# Patient Record
Sex: Male | Born: 1995
Health system: Southern US, Community
[De-identification: ages and names within clinical notes are randomized; demographics above are authoritative.]

## PROBLEM LIST (undated history)

## (undated) DIAGNOSIS — I10 Essential (primary) hypertension: Secondary | ICD-10-CM

## (undated) HISTORY — PX: KNEE SURGERY: SHX244

## (undated) HISTORY — PX: FOOT SURGERY: SHX648

---
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
## 2015-07-14 ENCOUNTER — Emergency Department (HOSPITAL_COMMUNITY)
Admission: EM | Admit: 2015-07-14 | Discharge: 2015-07-15 | Disposition: A | Discharge status: Home / Self Care | Payer: No Typology Code available for payment source | Attending: Emergency Medicine | Admitting: Emergency Medicine

## 2015-07-14 ENCOUNTER — Encounter (HOSPITAL_COMMUNITY): Payer: Self-pay | Admitting: *Deleted

## 2015-07-14 ENCOUNTER — Emergency Department (HOSPITAL_COMMUNITY): Payer: No Typology Code available for payment source

## 2015-07-14 DIAGNOSIS — S59902A Unspecified injury of left elbow, initial encounter: Secondary | ICD-10-CM | POA: Insufficient documentation

## 2015-07-14 DIAGNOSIS — Z9889 Other specified postprocedural states: Secondary | ICD-10-CM | POA: Insufficient documentation

## 2015-07-14 DIAGNOSIS — M79672 Pain in left foot: Secondary | ICD-10-CM | POA: Diagnosis not present

## 2015-07-14 DIAGNOSIS — S99912A Unspecified injury of left ankle, initial encounter: Secondary | ICD-10-CM | POA: Insufficient documentation

## 2015-07-14 DIAGNOSIS — S8992XA Unspecified injury of left lower leg, initial encounter: Secondary | ICD-10-CM | POA: Diagnosis not present

## 2015-07-14 DIAGNOSIS — F17200 Nicotine dependence, unspecified, uncomplicated: Secondary | ICD-10-CM | POA: Insufficient documentation

## 2015-07-14 DIAGNOSIS — Y92410 Unspecified street and highway as the place of occurrence of the external cause: Secondary | ICD-10-CM | POA: Diagnosis not present

## 2015-07-14 DIAGNOSIS — M25572 Pain in left ankle and joints of left foot: Secondary | ICD-10-CM

## 2015-07-14 DIAGNOSIS — Y998 Other external cause status: Secondary | ICD-10-CM | POA: Insufficient documentation

## 2015-07-14 DIAGNOSIS — M25522 Pain in left elbow: Secondary | ICD-10-CM

## 2015-07-14 DIAGNOSIS — Y9389 Activity, other specified: Secondary | ICD-10-CM | POA: Insufficient documentation

## 2015-07-14 DIAGNOSIS — M25562 Pain in left knee: Secondary | ICD-10-CM

## 2015-07-14 MED ORDER — OXYCODONE-ACETAMINOPHEN 5-325 MG PO TABS
2.0000 | ORAL_TABLET | Freq: Once | ORAL | Status: AC
  Administered 2015-07-14: 2 via ORAL
  Filled 2015-07-14: qty 2
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
## 2015-07-15 NOTE — Discharge Instructions (Signed)
All x-rays are normal. Ankle brace, ice to painful areas. Tylenol and/or ibuprofen for pain. You will be sore for several days. Follow-up with orthopedics if not getting better.

## 2016-10-29 DIAGNOSIS — J069 Acute upper respiratory infection, unspecified: Secondary | ICD-10-CM | POA: Diagnosis not present

## 2016-10-29 DIAGNOSIS — E663 Overweight: Secondary | ICD-10-CM | POA: Diagnosis not present

## 2016-10-29 DIAGNOSIS — J302 Other seasonal allergic rhinitis: Secondary | ICD-10-CM | POA: Diagnosis not present

## 2016-10-29 DIAGNOSIS — Z6826 Body mass index (BMI) 26.0-26.9, adult: Secondary | ICD-10-CM | POA: Diagnosis not present

## 2016-10-29 DIAGNOSIS — Z1389 Encounter for screening for other disorder: Secondary | ICD-10-CM | POA: Diagnosis not present

## 2016-10-29 DIAGNOSIS — M545 Low back pain: Secondary | ICD-10-CM | POA: Diagnosis not present

## 2016-10-29 DIAGNOSIS — E669 Obesity, unspecified: Secondary | ICD-10-CM | POA: Diagnosis not present

## 2016-10-29 DIAGNOSIS — H6693 Otitis media, unspecified, bilateral: Secondary | ICD-10-CM | POA: Diagnosis not present

## 2016-10-31 ENCOUNTER — Emergency Department (HOSPITAL_COMMUNITY): Payer: BLUE CROSS/BLUE SHIELD

## 2016-10-31 ENCOUNTER — Encounter (HOSPITAL_COMMUNITY): Payer: Self-pay | Admitting: Emergency Medicine

## 2016-10-31 ENCOUNTER — Emergency Department (HOSPITAL_COMMUNITY)
Admission: EM | Admit: 2016-10-31 | Discharge: 2016-10-31 | Disposition: A | Discharge status: Home / Self Care | Payer: BLUE CROSS/BLUE SHIELD | Attending: Emergency Medicine | Admitting: Emergency Medicine

## 2016-10-31 DIAGNOSIS — R42 Dizziness and giddiness: Secondary | ICD-10-CM | POA: Diagnosis not present

## 2016-10-31 DIAGNOSIS — F17200 Nicotine dependence, unspecified, uncomplicated: Secondary | ICD-10-CM | POA: Diagnosis not present

## 2016-10-31 DIAGNOSIS — N390 Urinary tract infection, site not specified: Secondary | ICD-10-CM | POA: Diagnosis not present

## 2016-10-31 DIAGNOSIS — H9201 Otalgia, right ear: Secondary | ICD-10-CM | POA: Diagnosis not present

## 2016-10-31 DIAGNOSIS — R51 Headache: Secondary | ICD-10-CM | POA: Insufficient documentation

## 2016-10-31 DIAGNOSIS — R1110 Vomiting, unspecified: Secondary | ICD-10-CM | POA: Diagnosis present

## 2016-10-31 DIAGNOSIS — H6121 Impacted cerumen, right ear: Secondary | ICD-10-CM | POA: Insufficient documentation

## 2016-10-31 LAB — CBC WITH DIFFERENTIAL/PLATELET
BASOS ABS: 0 10*3/uL (ref 0.0–0.1)
BASOS PCT: 0 %
EOS ABS: 0 10*3/uL (ref 0.0–0.7)
Eosinophils Relative: 0 %
HCT: 45.9 % (ref 39.0–52.0)
Hemoglobin: 16.3 g/dL (ref 13.0–17.0)
Lymphocytes Relative: 19 %
Lymphs Abs: 2.7 10*3/uL (ref 0.7–4.0)
MCH: 29.7 pg (ref 26.0–34.0)
MCHC: 35.5 g/dL (ref 30.0–36.0)
MCV: 83.8 fL (ref 78.0–100.0)
MONO ABS: 1.7 10*3/uL — AB (ref 0.1–1.0)
Monocytes Relative: 12 %
NEUTROS PCT: 69 %
Neutro Abs: 9.6 10*3/uL — ABNORMAL HIGH (ref 1.7–7.7)
PLATELETS: 246 10*3/uL (ref 150–400)
RBC: 5.48 MIL/uL (ref 4.22–5.81)
RDW: 13.5 % (ref 11.5–15.5)
WBC: 14 10*3/uL — AB (ref 4.0–10.5)

## 2016-10-31 LAB — COMPREHENSIVE METABOLIC PANEL
ALBUMIN: 3.9 g/dL (ref 3.5–5.0)
ALT: 30 U/L (ref 17–63)
AST: 21 U/L (ref 15–41)
Alkaline Phosphatase: 73 U/L (ref 38–126)
Anion gap: 14 (ref 5–15)
BUN: 19 mg/dL (ref 6–20)
CHLORIDE: 100 mmol/L — AB (ref 101–111)
CO2: 26 mmol/L (ref 22–32)
Calcium: 9.4 mg/dL (ref 8.9–10.3)
Creatinine, Ser: 1.08 mg/dL (ref 0.61–1.24)
GFR calc non Af Amer: >60 mL/min (ref >60)
Glucose, Bld: 96 mg/dL (ref 65–99)
Potassium: 3.3 mmol/L — ABNORMAL LOW (ref 3.5–5.1)
SODIUM: 140 mmol/L (ref 135–145)
Total Bilirubin: 0.9 mg/dL (ref 0.3–1.2)
Total Protein: 8.2 g/dL — ABNORMAL HIGH (ref 6.5–8.1)

## 2016-10-31 LAB — URINALYSIS, ROUTINE W REFLEX MICROSCOPIC
BACTERIA UA: NONE SEEN
GLUCOSE, UA: NEGATIVE mg/dL
HGB URINE DIPSTICK: NEGATIVE
Ketones, ur: 5 mg/dL — AB
NITRITE: NEGATIVE
PROTEIN: 100 mg/dL — AB
Specific Gravity, Urine: 1.03 (ref 1.005–1.030)
pH: 5 (ref 5.0–8.0)

## 2016-10-31 MED ORDER — ONDANSETRON HCL 4 MG PO TABS: 4.0000 mg | ORAL_TABLET | Freq: Four times a day (QID) | ORAL | 0 refills | Status: DC

## 2016-10-31 MED ORDER — ONDANSETRON HCL 4 MG/2ML IJ SOLN
4.0000 mg | Freq: Once | INTRAMUSCULAR | Status: AC
  Administered 2016-10-31: 4 mg via INTRAVENOUS
  Filled 2016-10-31: qty 2

## 2016-10-31 MED ORDER — CARBAMIDE PEROXIDE 6.5 % OT SOLN
10.0000 [drp] | Freq: Once | OTIC | Status: AC
  Administered 2016-10-31: 10 [drp] via OTIC
  Filled 2016-10-31: qty 15

## 2016-10-31 MED ORDER — CIPROFLOXACIN HCL 0.2 % OT SOLN: 0.2000 mL | Freq: Two times a day (BID) | OTIC | 0 refills | Status: DC

## 2016-10-31 MED ORDER — FAMOTIDINE IN NACL 20-0.9 MG/50ML-% IV SOLN
20.0000 mg | Freq: Once | INTRAVENOUS | Status: AC
  Administered 2016-10-31: 20 mg via INTRAVENOUS
  Filled 2016-10-31: qty 50

## 2016-10-31 MED ORDER — SODIUM CHLORIDE 0.9 % IV BOLUS (SEPSIS)
1000.0000 mL | Freq: Once | INTRAVENOUS | Status: AC
  Administered 2016-10-31: 1000 mL via INTRAVENOUS

## 2016-10-31 MED ORDER — SODIUM CHLORIDE 0.9 % IV BOLUS (SEPSIS)
1000.0000 mL | Freq: Once | INTRAVENOUS | Status: AC
Start: 2016-10-31 — End: 2016-10-31
  Administered 2016-10-31: 1000 mL via INTRAVENOUS

## 2016-10-31 NOTE — ED Notes (Signed)
Washed left ear, moderate amount of brown wax removed.  Noah Ryan in to evaluate.  Tolerated well.

## 2016-10-31 NOTE — ED Provider Notes (Signed)
[facility]-EMERGENCY DEPT Provider Note   CSN: [id number] Arrival date & time: 10/31/16  4098     History   Chief Complaint Chief Complaint  Patient presents with  . Hematemesis    HPI Noah Ryan is a 21 y.o. male.  HPI   Noah Ryan is a 21 y.o. male who presents to the Emergency Department complaining of persistent vomiting for 3 days.  He states that he developed vomiting 3 days and right ear pain.  He was seen by his PCP and advised that he had an ear infection and was started on Amoxil and Percocet.  He states the ear pain has improved, but vomiting has worsened and this morning noticed bright red blood in the vomitus which prompted him to seek ER evaluation.  Unable to tolerate fluids or food.  He also complains of generalized body aches, headaches intermittently, and dizziness with movement.  He denies fever, chills, abdominal pain, dysuria and diarrhea.    History reviewed. No pertinent past medical history.  There are no active problems to display for this patient.   Past Surgical History:  Procedure Laterality Date  . FOOT SURGERY    . KNEE SURGERY         Home Medications    Prior to Admission medications   Not on File    Family History History reviewed. No pertinent family history.  Social History Social History  Substance Use Topics  . Smoking status: Current Some Day Smoker  . Smokeless tobacco: Never Used  . Alcohol use Yes     Allergies   Patient has no known allergies.   Review of Systems Review of Systems  Constitutional: Positive for appetite change. Negative for chills and fever.  HENT: Negative for congestion, sore throat and trouble swallowing.   Respiratory: Negative for chest tightness and shortness of breath.   Cardiovascular: Negative for chest pain.  Gastrointestinal: Positive for nausea and vomiting. Negative for abdominal pain, anal bleeding, blood in stool and diarrhea.  Genitourinary: Negative for decreased urine  volume, difficulty urinating, dysuria and flank pain.  Musculoskeletal: Positive for myalgias. Negative for back pain.  Skin: Negative for color change and rash.  Neurological: Positive for dizziness and headaches. Negative for syncope, weakness and numbness.  Hematological: Negative for adenopathy.  All other systems reviewed and are negative.    Physical Exam Updated Vital Signs BP (!) 140/113 (BP Location: Left Arm) Comment: Repeat 153/113  Pulse 74   Temp 98 F (36.7 C) (Oral)   Resp 16   Ht 6\' 3"  (1.905 m)   Wt 94.3 kg (208 lb)   SpO2 98%   BMI 26.00 kg/m   Physical Exam  Constitutional: He is oriented to person, place, and time. He appears well-developed and well-nourished.  HENT:  Head: Normocephalic and atraumatic.  Left Ear: Tympanic membrane and ear canal normal.  Mouth/Throat: Uvula is midline. Mucous membranes are dry. No oropharyngeal exudate, posterior oropharyngeal edema or posterior oropharyngeal erythema.  Cerumen impaction right ear canal.  Unable to visualize TM  Eyes: Conjunctivae and EOM are normal. Pupils are equal, round, and reactive to light.  Neck: Normal range of motion.  Cardiovascular: Normal rate, regular rhythm, normal heart sounds and intact distal pulses.   No murmur heard. Pulmonary/Chest: Effort normal and breath sounds normal. No respiratory distress.  Abdominal: Soft. Bowel sounds are normal. He exhibits no distension and no mass. There is no tenderness. There is no rebound and no guarding.  Musculoskeletal: Normal range of  motion. He exhibits no edema.  Lymphadenopathy:    He has no cervical adenopathy.  Neurological: He is alert and oriented to person, place, and time. He exhibits normal muscle tone. Coordination normal.  Skin: Skin is warm. Capillary refill takes less than 2 seconds. He is diaphoretic.  Psychiatric: He has a normal mood and affect.  Nursing note and vitals reviewed.    ED Treatments / Results  Labs (all labs  ordered are listed, but only abnormal results are displayed) Labs Reviewed  CBC WITH DIFFERENTIAL/PLATELET - Abnormal; Notable for the following:       Result Value   WBC 14.0 (*)    Neutro Abs 9.6 (*)    Monocytes Absolute 1.7 (*)    All other components within normal limits  COMPREHENSIVE METABOLIC PANEL - Abnormal; Notable for the following:    Potassium 3.3 (*)    Chloride 100 (*)    Total Protein 8.2 (*)    All other components within normal limits  URINALYSIS, ROUTINE W REFLEX MICROSCOPIC - Abnormal; Notable for the following:    Color, Urine AMBER (*)    APPearance HAZY (*)    Bilirubin Urine SMALL (*)    Ketones, ur 5 (*)    Protein, ur 100 (*)    Leukocytes, UA MODERATE (*)    Squamous Epithelial / LPF 0-5 (*)    Non Squamous Epithelial 0-5 (*)    All other components within normal limits  URINE CULTURE  GC/CHLAMYDIA PROBE AMP ([place]) NOT AT [facility]    EKG  EKG Interpretation None       Radiology Ct Head Wo Contrast  Result Date: 10/31/2016 CLINICAL DATA:  Dizziness, vomiting for 4 days. EXAM: CT HEAD WITHOUT CONTRAST TECHNIQUE: Contiguous axial images were obtained from the base of the skull through the vertex without intravenous contrast. COMPARISON:  05/05/2012 FINDINGS: Brain: No acute intracranial abnormality. Specifically, no hemorrhage, hydrocephalus, mass lesion, acute infarction, or significant intracranial injury. Vascular: No hyperdense vessel or unexpected calcification. Skull: No acute calvarial abnormality. Sinuses/Orbits: Visualized paranasal sinuses are clear. Orbital soft tissues unremarkable. Opacified ethmoid air cells bilaterally. Other: None IMPRESSION: No intracranial abnormality. Near complete opacification of the ethmoid air cells bilaterally compatible with bilateral mastoid effusions, possibly mastoiditis. Electronically Signed   By: Charlett NoseKevin  Dover M.D.   On: 10/31/2016 10:37    Procedures Procedures (including critical care  time)  Medications Ordered in ED Medications  sodium chloride 0.9 % bolus 1,000 mL (0 mLs Intravenous Stopped 10/31/16 1023)  ondansetron (ZOFRAN) injection 4 mg (4 mg Intravenous Given 10/31/16 0909)  carbamide peroxide (DEBROX) 6.5 % OTIC (EAR) solution 10 drop (10 drops Right EAR Given 10/31/16 0938)  famotidine (PEPCID) IVPB 20 mg premix (0 mg Intravenous Stopped 10/31/16 1019)  sodium chloride 0.9 % bolus 1,000 mL (0 mLs Intravenous Stopped 10/31/16 1123)     Initial Impression / Assessment and Plan / ED Course  I have reviewed the triage vital signs and the nursing notes.  Pertinent labs & imaging results that were available during my care of the patient were reviewed by me and considered in my medical decision making (see chart for details).     [id number]  Pt also seen by Dr. Hyacinth MeekerMiller and care plan discussed,   1050  Right ear canal irrigated by nursing.  Large amt of cerumen removed, pt feeling better. Right TM now visualized and appears opacified.  No vomiting during ed stay, will try po fluid challenge.   CT head shows  bilateral mastoid effusions, possible mastoiditis, but more likely related to chronic OM.  Pt currently taking amoxil, will add cipro otic soln  Pt has received IVF's, has urinated and reports feeling better and requesting discharge.  Tolerated po fluids w/o difficulty.  Abd remains soft and NT.  Doubt acute abdomen.  Urine culture and GC chlamydia culture pending.  He appears stable for d/c, return precautions discussed.   Final Clinical Impressions(s) / ED Diagnoses   Final diagnoses:  Right ear pain  Urinary tract infection in male    New Prescriptions New Prescriptions   No medications on file     Pauline Aus, Cordelia Poche 11/01/16 4098    Eber Hong, MD 11/03/16 [phone]

## 2016-10-31 NOTE — Discharge Instructions (Signed)
Continue your current antibiotic.  Its important that you drink plenty of water for the next several days.  Keep your ENT appt as scheduled.  You will be notified if your urine results are positive.  Return to ER for any worsening symptoms

## 2016-10-31 NOTE — ED Provider Notes (Signed)
The patient is a 21 year old male, he has a history of approximately 5 days of right ear pain, headache, body aches, neck discomfort as well as feeling like he is off balance when he stands up. He reports multiple episodes of vomiting in the last 24 hours and very little oral intake over the last couple of days. He has tried to drink water and even vomits. He had some blood-streaked vomit today which is what raised his concern. He has artery been seen at his family doctor's office several days ago during which time he was given antibiotics for possible ear infection as well as Percocet for the pain. He has not been taking much of the Percocet but has been taking antibiotics.  On exam the patient is in no distress, normal heart rate in the 70s, soft nontender abdomen with no hepatosplenomegaly, no edema or rashes, oropharynx is dry, no exudate asymmetry or hypertrophy. Tympanic membrane is not visualized on the right due to cerumen, the left hepatic membrane is normal. He has full range motion of his neck but has some discomfort with palpation along the right strap muscles. He has normal lung sounds without wheezing rhonchi or rales.  Overall the patient does not have an obvious etiology of his symptoms. He has found a couple of ticks on his clothing but no rashes, no fevers. He has no exposure to other people that aren't sick. 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  At this time the patient will get IV fluids, lab workup, CT scan of the brain to evaluate for causes of headache, vomiting and being off balance though this could just be orthostasis from dehydration. The patient expresses understanding to the indications for the workup  Medical screening examination/treatment/procedure(s) were conducted as a shared visit with non-physician practitioner(s) and myself.  I personally evaluated the patient during the  encounter.  Clinical Impression:   Final diagnoses:  Right ear pain  Urinary tract infection in male         Eber HongMiller, Bawi Lakins, MD 11/03/16 [phone]

## 2016-10-31 NOTE — ED Triage Notes (Addendum)
Patient complaining of vomiting x 8-9 times this morning with vomiting blood with last 3 times. Also complaining of generalized body aches. States "I went to my doctor for a right ear infection Tuesday and they gave me amoxicillin and percocet."

## 2016-11-01 LAB — URINE CULTURE: Culture: NO GROWTH

## 2016-11-04 DIAGNOSIS — H833X9 Noise effects on inner ear, unspecified ear: Secondary | ICD-10-CM | POA: Diagnosis not present

## 2016-11-04 DIAGNOSIS — H906 Mixed conductive and sensorineural hearing loss, bilateral: Secondary | ICD-10-CM | POA: Diagnosis not present

## 2016-11-04 DIAGNOSIS — H833X3 Noise effects on inner ear, bilateral: Secondary | ICD-10-CM | POA: Diagnosis not present

## 2016-11-04 DIAGNOSIS — H65196 Other acute nonsuppurative otitis media, recurrent, bilateral: Secondary | ICD-10-CM | POA: Diagnosis not present

## 2017-11-21 ENCOUNTER — Other Ambulatory Visit (HOSPITAL_COMMUNITY): Payer: Self-pay | Admitting: Family Medicine

## 2017-11-21 ENCOUNTER — Ambulatory Visit (HOSPITAL_COMMUNITY)
Admission: RE | Admit: 2017-11-21 | Discharge: 2017-11-21 | Disposition: A | Discharge status: Home / Self Care | Payer: 59 | Source: Ambulatory Visit | Attending: Family Medicine | Admitting: Family Medicine

## 2017-11-21 DIAGNOSIS — S0990XA Unspecified injury of head, initial encounter: Secondary | ICD-10-CM | POA: Diagnosis not present

## 2017-11-21 DIAGNOSIS — X58XXXA Exposure to other specified factors, initial encounter: Secondary | ICD-10-CM | POA: Diagnosis not present

## 2017-11-21 DIAGNOSIS — J329 Chronic sinusitis, unspecified: Secondary | ICD-10-CM | POA: Diagnosis not present

## 2017-11-21 DIAGNOSIS — R55 Syncope and collapse: Secondary | ICD-10-CM | POA: Diagnosis not present

## 2017-11-21 DIAGNOSIS — J209 Acute bronchitis, unspecified: Secondary | ICD-10-CM | POA: Insufficient documentation

## 2017-11-21 DIAGNOSIS — R51 Headache: Secondary | ICD-10-CM | POA: Diagnosis not present

## 2017-11-21 DIAGNOSIS — M2548 Effusion, other site: Secondary | ICD-10-CM | POA: Diagnosis not present

## 2017-11-21 DIAGNOSIS — R05 Cough: Secondary | ICD-10-CM | POA: Diagnosis not present

## 2020-02-28 IMAGING — CT CT HEAD W/O CM
3 series · 15 of 47 positions shown, 18 images · non-contrast
Comparison: 10/31/2016 and prior CTs

CLINICAL DATA: 21-year-old male with multiple episodes of syncope
over the last few days. Headache.

EXAM:
CT HEAD WITHOUT CONTRAST
TECHNIQUE: Contiguous axial images were obtained from the base of the skull
through the vertex without intravenous contrast.

[Series 2: head trauma wo · axial · 0.46mm/px · z∈[+63,+203]mm · 9 of 34 slices shown, 12 images]
[im 3/34  brain]
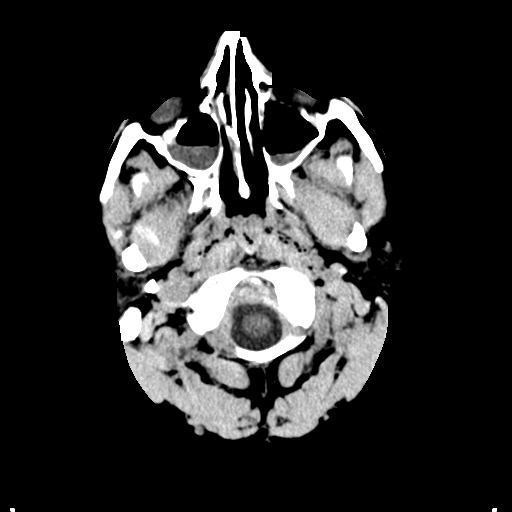
[im 3/34  bone]
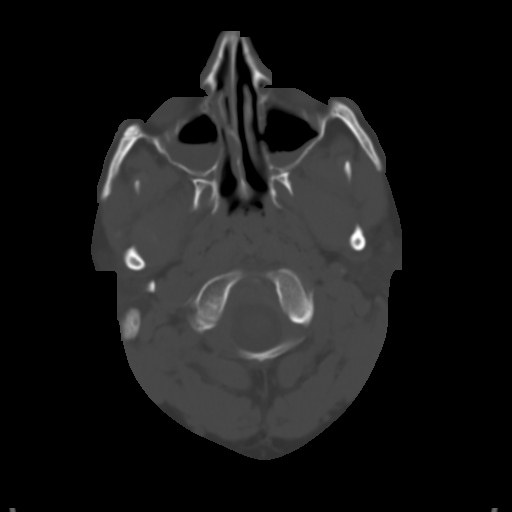
[im 6/34  brain]
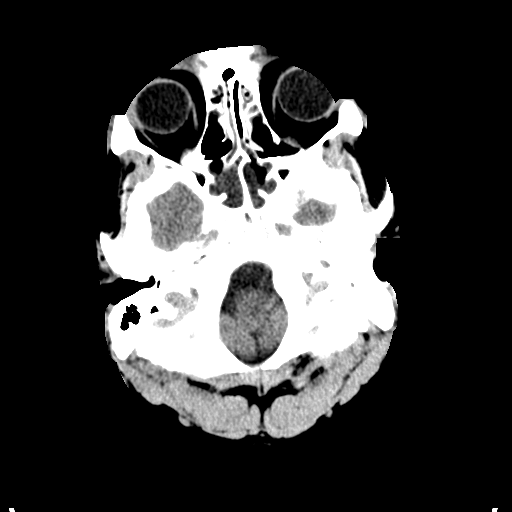
[im 10/34  brain]
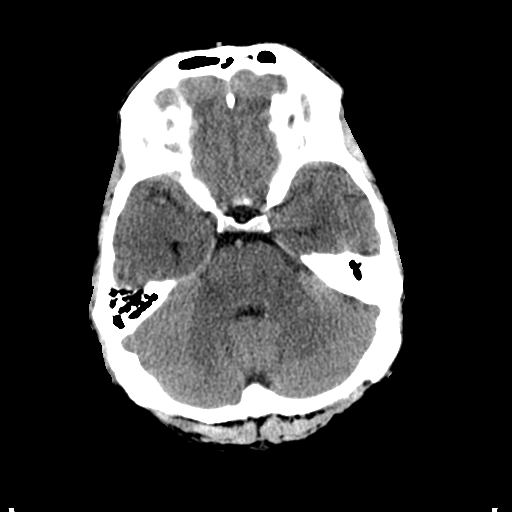
[im 13/34  brain]
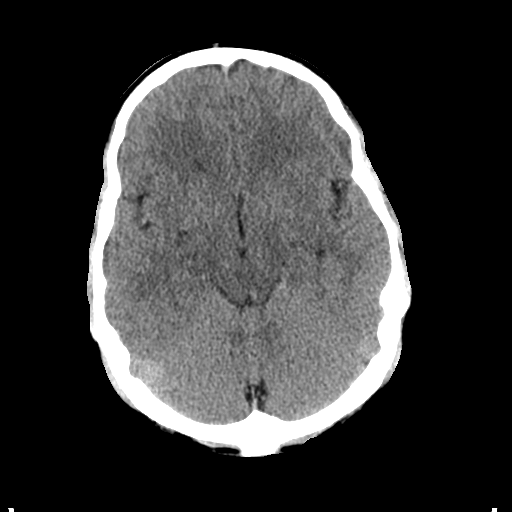
[im 18/34  brain]
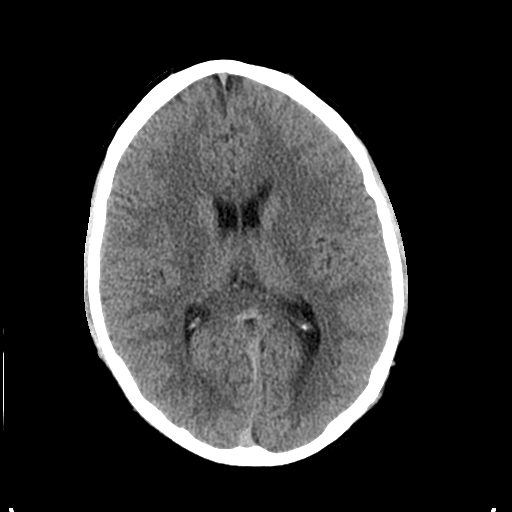
[im 18/34  bone]
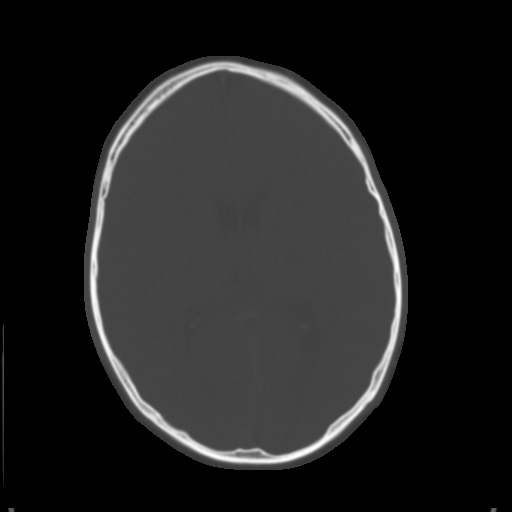
[im 21/34  brain]
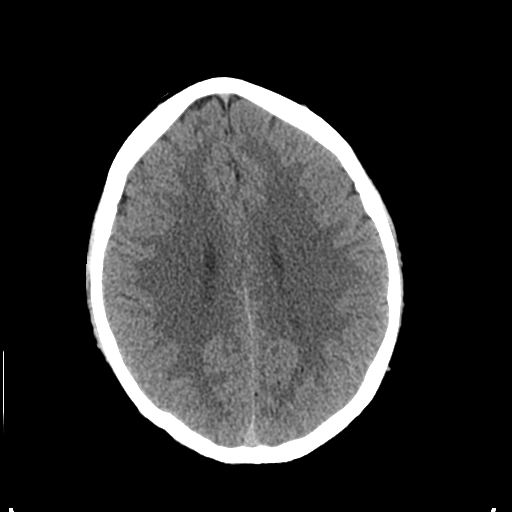
[im 24/34  brain]
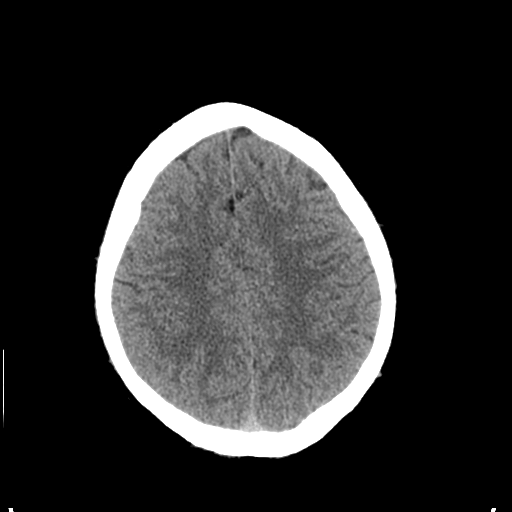
[im 28/34  brain]
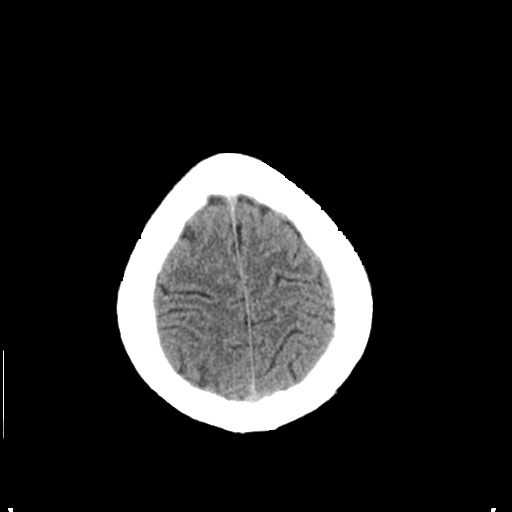
[im 31/34  brain]
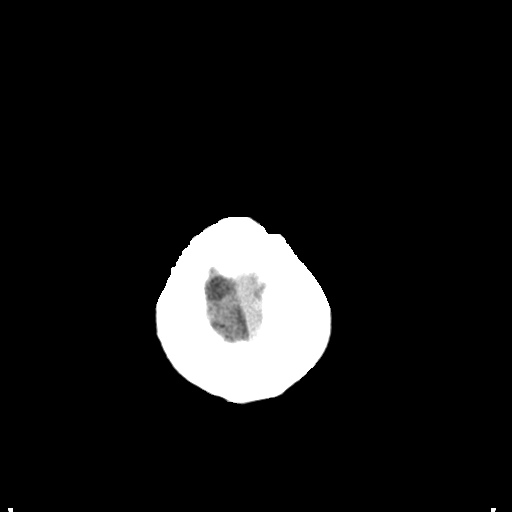
[im 31/34  bone]
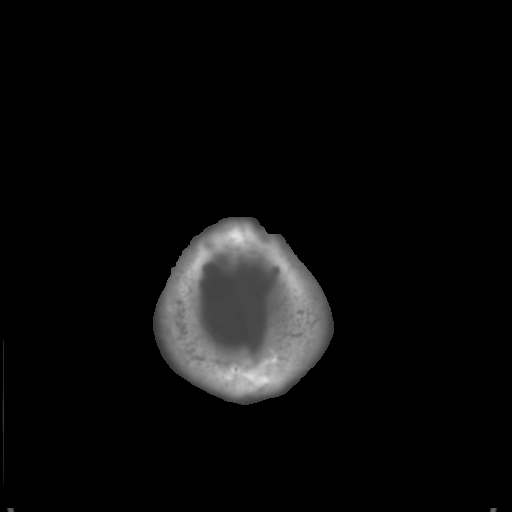

[Series 4: coronal soft tissue · coronal · 0.35mm/px · 3 of 69 slices shown]
[im 23/69  brain]
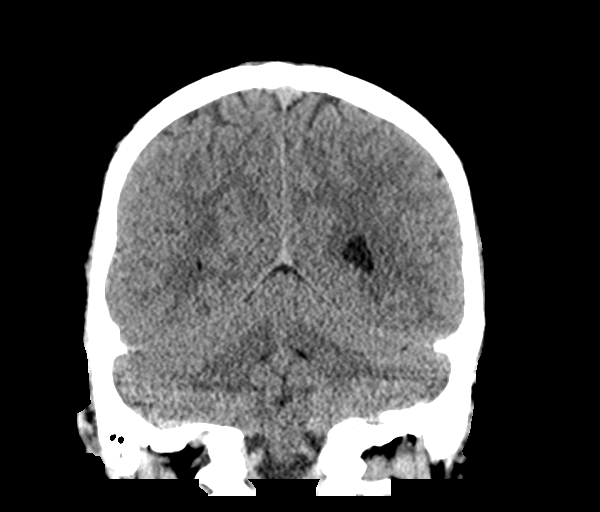
[im 31/69  brain]
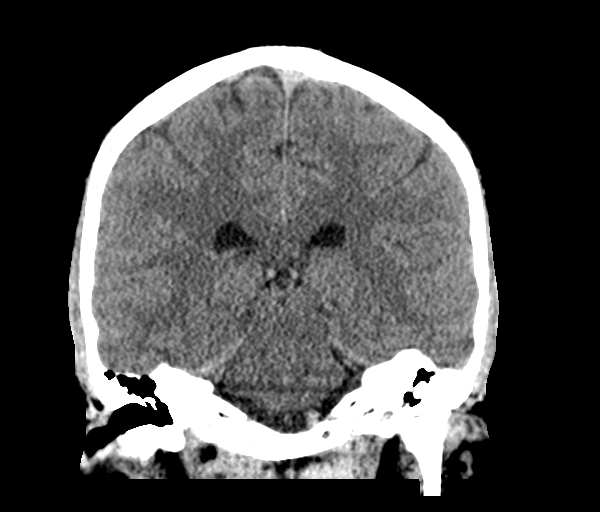
[im 38/69  brain]
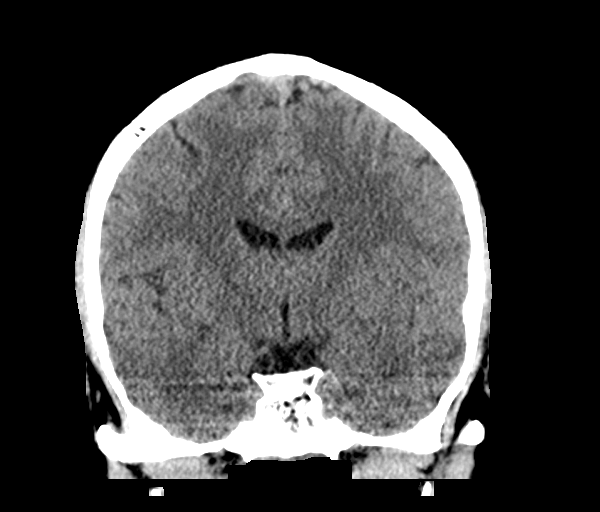

[Series 5: sagittal soft tissue · sagittal · 0.33mm/px · 3 of 56 slices shown]
[im 19/56  brain]
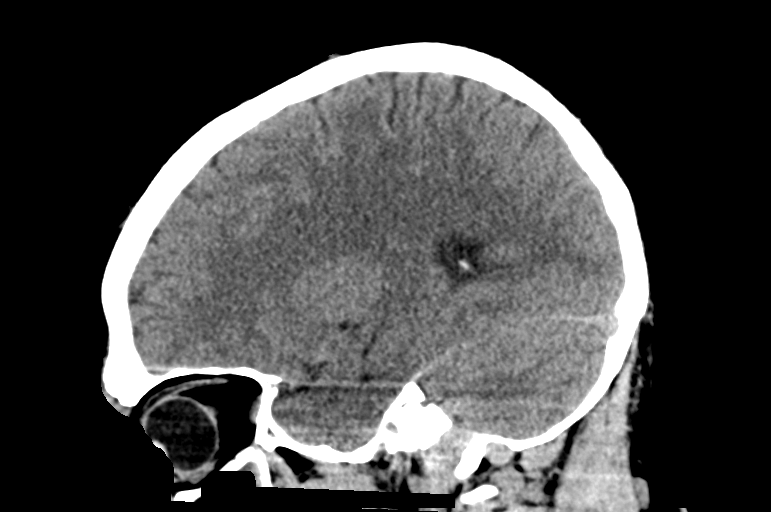
[im 28/56  brain]
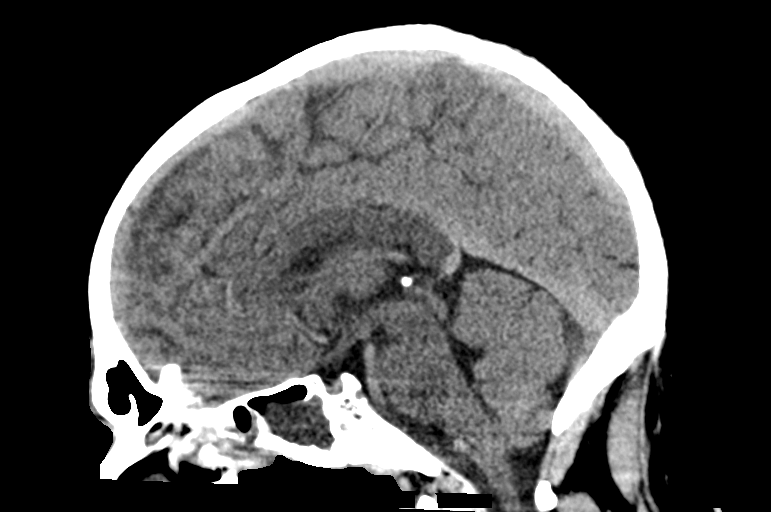
[im 37/56  brain]
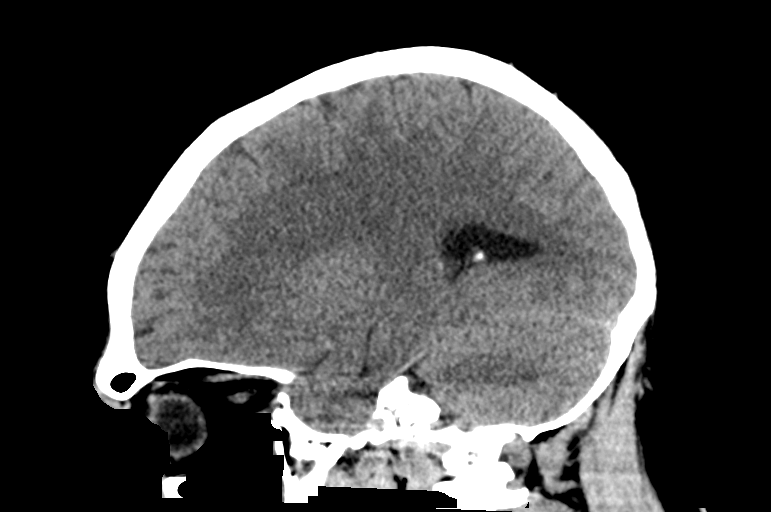

[15 of 47 positions shown; findings below may reference images not displayed]

FINDINGS: Brain: No evidence of acute infarction, hemorrhage, hydrocephalus,
extra-axial collection or mass lesion/mass effect.

Vascular: No hyperdense vessel or unexpected calcification.

Skull: Normal. Negative for fracture or focal lesion.

Sinuses/Orbits: Fluid in both maxillary and sphenoid sinuses noted.
Near complete opacification of scattered ethmoid air cells
identified.

A LEFT mastoid effusion and fluid in the LEFT middle ear again
noted.

Other: None
IMPRESSION: 1. No intracranial abnormality
2. Bilateral paranasal sinus disease likely representing acute
sinusitis
3. LEFT mastoid effusion and middle ear fluid again noted.

## 2020-02-28 IMAGING — DX DG CHEST 2V
2 series · 2 of 2 positions shown · non-contrast
Comparison: None.

CLINICAL DATA: Cough and pain for 3 days

EXAM:
CHEST - 2 VIEW

[chest pa]
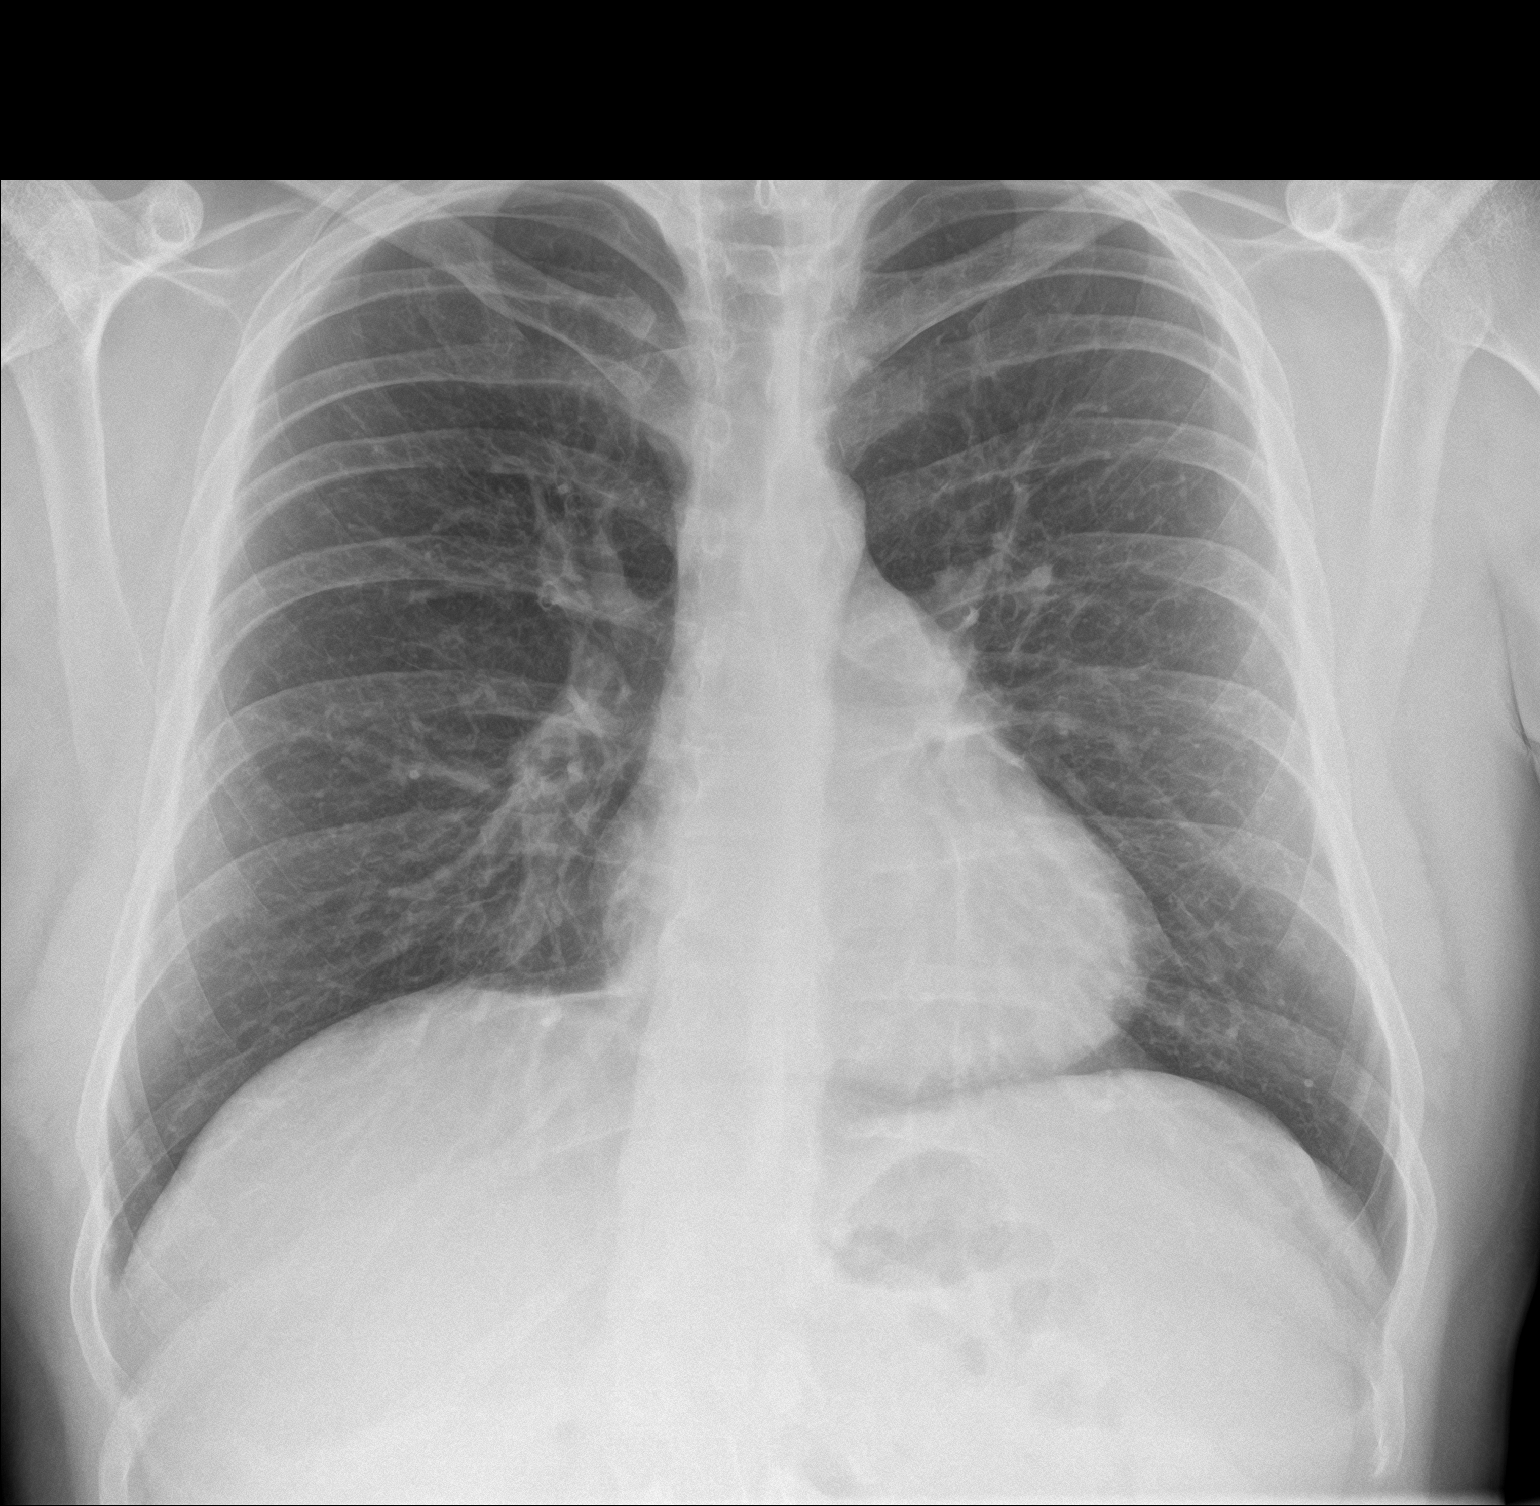

[chest lat]
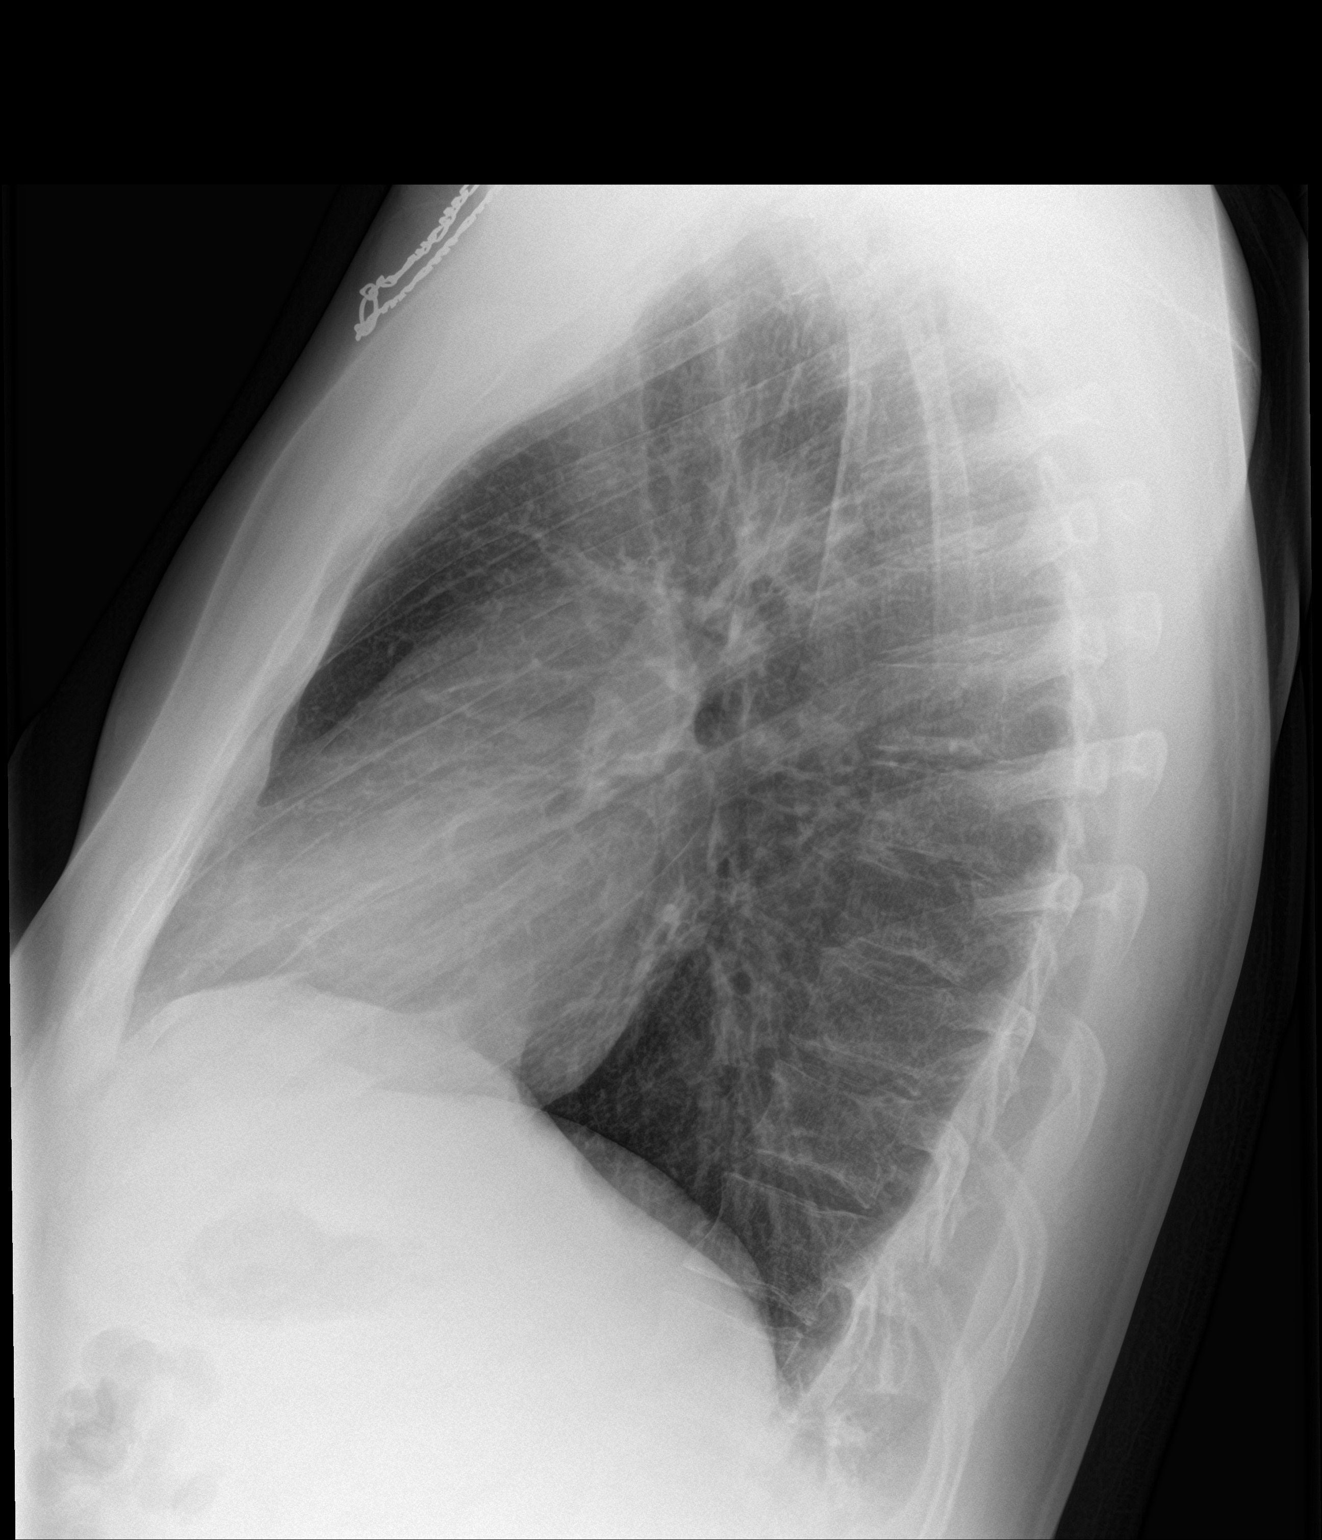

[2 of 2 positions shown; findings below may reference images not displayed]

FINDINGS: Normal heart size. Lungs clear. No pneumothorax. No pleural
effusion.
IMPRESSION: No active cardiopulmonary disease.

## 2024-05-04 ENCOUNTER — Other Ambulatory Visit: Payer: Self-pay

## 2024-05-04 ENCOUNTER — Encounter (HOSPITAL_COMMUNITY): Payer: Self-pay

## 2024-05-04 ENCOUNTER — Emergency Department

## 2024-05-04 ENCOUNTER — Emergency Department (HOSPITAL_COMMUNITY)

## 2024-05-04 ENCOUNTER — Emergency Department (HOSPITAL_COMMUNITY)
Admission: EM | Admit: 2024-05-04 | Discharge: 2024-05-04 | Disposition: A | Discharge status: Home / Self Care | Source: Ambulatory Visit | Attending: Emergency Medicine | Admitting: Emergency Medicine

## 2024-05-04 ENCOUNTER — Ambulatory Visit: Admission: EM | Admit: 2024-05-04 | Discharge: 2024-05-04 | Disposition: A | Discharge status: Home / Self Care

## 2024-05-04 ENCOUNTER — Telehealth: Payer: Self-pay

## 2024-05-04 ENCOUNTER — Encounter: Payer: Self-pay | Admitting: Emergency Medicine

## 2024-05-04 DIAGNOSIS — R55 Syncope and collapse: Secondary | ICD-10-CM

## 2024-05-04 DIAGNOSIS — Z820 Family history of epilepsy and other diseases of the nervous system: Secondary | ICD-10-CM

## 2024-05-04 HISTORY — DX: Essential (primary) hypertension: I10

## 2024-05-04 LAB — COMPREHENSIVE METABOLIC PANEL WITH GFR
ALT: 36 U/L (ref 0–44)
AST: 22 U/L (ref 15–41)
Albumin: 5 g/dL (ref 3.5–5.0)
Alkaline Phosphatase: 78 U/L (ref 38–126)
Anion gap: 16 — ABNORMAL HIGH (ref 5–15)
BUN: 11 mg/dL (ref 6–20)
CO2: 20 mmol/L — ABNORMAL LOW (ref 22–32)
Calcium: 9.3 mg/dL (ref 8.9–10.3)
Chloride: 106 mmol/L (ref 98–111)
Creatinine, Ser: 0.79 mg/dL (ref 0.61–1.24)
GFR, Estimated: >60 mL/min (ref >60)
Glucose, Bld: 98 mg/dL (ref 70–99)
Potassium: 3.8 mmol/L (ref 3.5–5.1)
Sodium: 141 mmol/L (ref 135–145)
Total Bilirubin: 0.7 mg/dL (ref 0.0–1.2)
Total Protein: 7.6 g/dL (ref 6.5–8.1)

## 2024-05-04 LAB — CBC WITH DIFFERENTIAL/PLATELET
Abs Immature Granulocytes: 0.04 K/uL (ref 0.00–0.07)
Basophils Absolute: 0 K/uL (ref 0.0–0.1)
Basophils Relative: 0 %
Eosinophils Absolute: 0 K/uL (ref 0.0–0.5)
Eosinophils Relative: 0 %
HCT: 50.3 % (ref 39.0–52.0)
Hemoglobin: 17.1 g/dL — ABNORMAL HIGH (ref 13.0–17.0)
Immature Granulocytes: 0 %
Lymphocytes Relative: 28 %
Lymphs Abs: 3.1 K/uL (ref 0.7–4.0)
MCH: 28.8 pg (ref 26.0–34.0)
MCHC: 34 g/dL (ref 30.0–36.0)
MCV: 84.8 fL (ref 80.0–100.0)
Monocytes Absolute: 0.7 K/uL (ref 0.1–1.0)
Monocytes Relative: 6 %
Neutro Abs: 7.2 K/uL (ref 1.7–7.7)
Neutrophils Relative %: 66 %
Platelets: 267 K/uL (ref 150–400)
RBC: 5.93 MIL/uL — ABNORMAL HIGH (ref 4.22–5.81)
RDW: 13.7 % (ref 11.5–15.5)
WBC: 11 K/uL — ABNORMAL HIGH (ref 4.0–10.5)
nRBC: 0 % (ref 0.0–0.2)

## 2024-05-04 LAB — CBG MONITORING, ED: Glucose-Capillary: 105 mg/dL — ABNORMAL HIGH (ref 70–99)

## 2024-05-04 LAB — TROPONIN T, HIGH SENSITIVITY
Troponin T High Sensitivity: <15 ng/L (ref 0–19)
Troponin T High Sensitivity: <15 ng/L (ref 0–19)

## 2024-05-04 LAB — D-DIMER, QUANTITATIVE: D-Dimer, Quant: 0.27 ug{FEU}/mL (ref 0.00–0.50)

## 2024-05-04 LAB — MAGNESIUM: Magnesium: 2.2 mg/dL (ref 1.7–2.4)

## 2024-05-04 NOTE — ED Notes (Signed)
 ED Provider at bedside.

## 2024-05-04 NOTE — Discharge Instructions (Addendum)
 Providers Accepting New Patients in [place], [place]    [facility] [address]  [place], [place] [address]J [phone] Accepts most insurances  [facility] [address] [place], [place] [address] [phone] Accepts most insurances  [facility] [address] [place], [place] [address]  [phone] Must meet requirements  [facility] [address] [place], [place] [address] [phone] Accepts most insurances  [facility] [address]  [place], [place] [address] [phone] Accepts most insurances  [facility] [address]   [place], [place]   [phone] Accepts most insurances  [facility])  [phone] [address]  [place], [place] [address] [phone] Accepts most insurances      [facility] [address]  [place], [place] [address] [phone] Accepts most insurances  [facility] [address] [place], [place] [address] [phone] option 1 Accepts Medicaid and [facility] [address]  [place], [place] [address] [phone] Accepts most insurances  Benita Outhouse, MD [address] [place], [place] [address] [phone] Accepts most insurances  [facility] at [facility] [address]  [place], [place] [address] [phone] Accepts most insurances  [facility] [phone] [address] [place], [place] [address] [phone] Accepts most insurances  [place] [place] [address] [place], [place] [address] [phone]  Accepts most insurances   As discussed, for driving at this time until you are medically cleared to do show by your primary care provider or other specialist.  Someone from the cardiology office will be contacting you to arrange follow-up appointment.  In the meantime, if you develop any new  or worsening symptoms please return to the emergency department

## 2024-05-04 NOTE — ED Provider Notes (Signed)
 [place] EMERGENCY DEPARTMENT AT [facility] Provider Note   CSN: [id number] Arrival date & time: 05/04/24  9076     Patient presents with: syncopal episode   Noah Ryan is a 28 y.o. male.  {Add pertinent medical, surgical, social history, OB history to HPI:32947} HPI     Noah Ryan is a 28 y.o. male past medical history of hypertension, not currently medicated, who presents to the Emergency Department for evaluation of palpitations and syncope.  States he had a witnessed syncopal episode last week that was witnessed by his wife.  States that he was standing talking to her when he felt weird and passed out.  Stated that he was out for few seconds approximately.  When he awoke, states he felt confused and disoriented.  When the episodes occur, he feels like everything closes in on him and that it feels like his heart is going to stop.  No witnessed seizure-like activity.  He had a similar episode yesterday while driving.  States he pulled over to the side of the road when he did not feel right unsure how long the episode lasted but again felt disoriented and confused.  Currently, he denies any chest pains but states that he feels anxious.  Denies any drug use, excessive alcohol use.  No recent medications denies excessive caffeine intake.  States his father has history of epilepsy. No head recent injury   Prior to Admission medications   Medication Sig Start Date End Date Taking? Authorizing Provider  acetaminophen  (TYLENOL ) 325 MG tablet Take 650 mg by mouth every 6 (six) hours as needed for mild pain, moderate pain or headache.    [provider]  amoxicillin (AMOXIL) 875 MG tablet Take 875 mg by mouth 2 (two) times daily.    [provider]  Ciprofloxacin  HCl 0.2 % otic solution Place 0.2 mLs into the right ear 2 (two) times daily. 10/31/16   Fernado Brigante, PA-C  Homeopathic Products (EARACHE DROPS OT) Place 4-5 drops in ear(s) daily as needed  (ear pain). Right ear    [provider]  ibuprofen  (ADVIL ,MOTRIN ) 200 MG tablet Take 600-800 mg by mouth every 6 (six) hours as needed for headache, mild pain or moderate pain.    [provider]  ondansetron  (ZOFRAN ) 4 MG tablet Take 1 tablet (4 mg total) by mouth every 6 (six) hours. 10/31/16   Jannifer Fischler, PA-C  oxyCODONE -acetaminophen  (PERCOCET/ROXICET) 5-325 MG tablet Take 1-2 tablets by mouth every 8 (eight) hours as needed for severe pain.    [provider]    Allergies: Bee venom    Review of Systems  Constitutional:  Negative for chills and fever.  Respiratory:  Negative for cough and shortness of breath.   Cardiovascular:  Positive for palpitations. Negative for chest pain and leg swelling.  Gastrointestinal:  Negative for abdominal pain, nausea and vomiting.  Genitourinary:  Negative for dysuria.  Neurological:  Positive for syncope. Negative for dizziness, facial asymmetry, speech difficulty, weakness and headaches.    Updated Vital Signs BP (!) 141/109   Pulse 89   Temp 98.3 F (36.8 C) (Oral)   Resp 18   Ht 6' 3 (1.905 m)   Wt 94.3 kg   SpO2 94%   BMI 25.98 kg/m   Physical Exam Vitals and nursing note reviewed.  Constitutional:      General: He is not in acute distress.    Appearance: Normal appearance. He is not ill-appearing or toxic-appearing.  HENT:  Mouth/Throat:     Mouth: Mucous membranes are moist.     Pharynx: Oropharynx is clear.  Eyes:     Extraocular Movements: Extraocular movements intact.     Conjunctiva/sclera: Conjunctivae normal.     Pupils: Pupils are equal, round, and reactive to light.  Neck:     Thyroid: No thyromegaly.     Trachea: Phonation normal.  Cardiovascular:     Rate and Rhythm: Normal rate and regular rhythm.     Pulses: Normal pulses.  Pulmonary:     Effort: Pulmonary effort is normal.  Abdominal:     Palpations: Abdomen is soft.     Tenderness: There is no abdominal tenderness.   Musculoskeletal:        General: Normal range of motion.     Cervical back: Normal range of motion. No tenderness.  Lymphadenopathy:     Cervical: No cervical adenopathy.  Skin:    General: Skin is warm.     Capillary Refill: Capillary refill takes less than 2 seconds.  Neurological:     General: No focal deficit present.     Mental Status: He is alert.     GCS: GCS eye subscore is 4. GCS verbal subscore is 5. GCS motor subscore is 6.     Cranial Nerves: Cranial nerves 2-12 are intact.     Sensory: Sensation is intact. No sensory deficit.     Motor: Motor function is intact. No weakness or pronator drift.     Coordination: Coordination is intact.     (all labs ordered are listed, but only abnormal results are displayed) Labs Reviewed  COMPREHENSIVE METABOLIC PANEL WITH GFR - Abnormal; Notable for the following components:      Result Value   CO2 20 (*)    Anion gap 16 (*)    All other components within normal limits  CBC WITH DIFFERENTIAL/PLATELET - Abnormal; Notable for the following components:   WBC 11.0 (*)    RBC 5.93 (*)    Hemoglobin 17.1 (*)    All other components within normal limits  CBG MONITORING, ED - Abnormal; Notable for the following components:   Glucose-Capillary 105 (*)    All other components within normal limits  MAGNESIUM  D-DIMER, QUANTITATIVE  URINALYSIS, ROUTINE W REFLEX MICROSCOPIC  URINE DRUG SCREEN  TROPONIN T, HIGH SENSITIVITY    EKG: EKG Interpretation Date/Time:  Tuesday May 04 2024 09:43:49 EST Ventricular Rate:  79 PR Interval:  174 QRS Duration:  115 QT Interval:  397 QTC Calculation: 456 R Axis:   82  Text Interpretation: Sinus arrhythmia Nonspecific intraventricular conduction delay No significant change since prior 4/11 Confirmed by Towana Sharper [phone]) on 05/04/2024 9:49:02 AM  Radiology: [facility] Chest Port 1 View Result Date: 05/04/2024 CLINICAL DATA:  Syncope EXAM: PORTABLE CHEST 1 VIEW COMPARISON:  11/13/2017  FINDINGS: Opacity along the left heart border may be collapse/consolidation in the lower left lung. Right lung clear. The cardiopericardial silhouette is within normal limits for size. Telemetry leads overlie the chest. IMPRESSION: Opacity along the left heart border may be collapse/consolidation in the lower left lung. Dedicated upright PA and lateral chest x-ray or CT chest without contrast recommended to further evaluate. Electronically Signed   By: Camellia Candle M.D.   On: 05/04/2024 10:33    {Document cardiac monitor, telemetry assessment procedure when appropriate:32947} Procedures   Medications Ordered in the ED - No data to display    {Click here for ABCD2, HEART and other calculators REFRESH Note before  signing:1}                              Medical Decision Making   Pt here for evaluation of two recent syncopal episodes.  One last week that was witnessed and another last evening that occurred while driving.   Amount and/or Complexity of Data Reviewed Labs: ordered.    Details: No significant leukocytosis, chemistries unremarkable, D-dimer magnesium initial troponin reassuring Radiology: ordered.    Details: Chest x-ray shows opacity left lower lung border possible collapse versus consolidation ECG/medicine tests: ordered.    Details: EKG shows sinus arrhythmia, nonspecific intraventricular conduction delay.  No significant change from prior Discussion of management or test interpretation with external provider(s): Discussed with ED physician, Dr. Towana and discussed care plan.    Spoke with cardiology, Dr. Mallipeddi.  Discussed findings, she will have patient seen in office.  I will place ambulatory referral to cardiology as he will likely need Holter monitoring.  Advised patient not to drive or operate machinery until cleared by his primary care provider or other specialist.  Spoke with patient's spouse who is at bedside states that she will be able to transport him to work and  as needed     {Document critical care time when appropriate  Document review of labs and clinical decision tools ie CHADS2VASC2, etc  Document your independent review of radiology images and any outside records  Document your discussion with family members, caretakers and with consultants  Document social determinants of health affecting pt's care  Document your decision making why or why not admission, treatments were needed:32947:::1}   Final diagnoses:  None    ED Discharge Orders     None

## 2024-05-04 NOTE — ED Triage Notes (Signed)
 Pt reports weakness x1 week. Reports possible syncope/seizure x2 times within last week. Pt reports hx of same in the past as well as familial history of epilepsy.

## 2024-05-04 NOTE — ED Notes (Signed)
 Gave pt a urinal, pt said he is unable to provide a urine sample right now, states he hasn't drank much today.

## 2024-05-04 NOTE — ED Triage Notes (Signed)
 Pt arrived via POV c/o recent syncopal episodes and palpitations since last Thursaday and last syncopal episode was last night. Pt reports he felt weird and reports telling his wife this prior to falling on to the floor and reports family Hx of epilepsy with his father. Pt reports confusion and disorientation following his syncopal episodes.  EDP present at bedside during Triage.

## 2024-05-04 NOTE — Telephone Encounter (Signed)
 Secure chat from Dr.Mallipeddi for 14 day Live Zio monitor for syncope mailed to patients home.

## 2024-05-07 NOTE — ED Provider Notes (Signed)
 [facility]    CSN: [id number] Arrival date & time: 05/04/24  0825      History   Chief Complaint Chief Complaint  Patient presents with   Fatigue    HPI Noah Ryan is a 28 y.o. male.   Patient presenting today with 2 episodes of syncope/possible seizure activity within the past week, most recently last night.  He states he has had 2 episodes previously within the last 5 or so years but they were very spread out so he decided to largely ignore them at that time and never sought care for them.  He states his wife did witness 1 of these recent ones and noted that he was unconscious for several minutes prior to coming to.  He states he is very confused after the episodes but does not seem to experience urinary incontinence or tongue biting during the episodes.  His wife did not notice any convulsive body movements during the episode she witnessed.  He notes he has a family history of epilepsy and is wondering if he can be checked for this.  He denies any associated chest pain, shortness of breath, palpitations, headaches, weakness numbness or tingling of extremities, head injury, mental status changes, new prescription medications or new lifestyle changes.    Past Medical History:  Diagnosis Date   Hypertension     There are no active problems to display for this patient.   Past Surgical History:  Procedure Laterality Date   FOOT SURGERY     KNEE SURGERY         Home Medications    Prior to Admission medications  Medication Sig Start Date End Date Taking? Authorizing Provider  acetaminophen  (TYLENOL ) 325 MG tablet Take 650 mg by mouth every 6 (six) hours as needed for mild pain, moderate pain or headache.    [provider]  amoxicillin (AMOXIL) 875 MG tablet Take 875 mg by mouth 2 (two) times daily.    [provider]  Ciprofloxacin  HCl 0.2 % otic solution Place 0.2 mLs into the right ear 2 (two) times daily. 10/31/16   Triplett, Tammy,  PA-C  Homeopathic Products (EARACHE DROPS OT) Place 4-5 drops in ear(s) daily as needed (ear pain). Right ear    [provider]  ibuprofen  (ADVIL ,MOTRIN ) 200 MG tablet Take 600-800 mg by mouth every 6 (six) hours as needed for headache, mild pain or moderate pain.    [provider]  ondansetron  (ZOFRAN ) 4 MG tablet Take 1 tablet (4 mg total) by mouth every 6 (six) hours. 10/31/16   Triplett, Tammy, PA-C  oxyCODONE -acetaminophen  (PERCOCET/ROXICET) 5-325 MG tablet Take 1-2 tablets by mouth every 8 (eight) hours as needed for severe pain.    [provider]    Family History History reviewed. No pertinent family history.  Social History Social History[1]   Allergies   Bee venom   Review of Systems Review of Systems Per HPI  Physical Exam Triage Vital Signs ED Triage Vitals  Encounter Vitals Group     BP 05/04/24 0840 (!) 142/92     Girls Systolic BP Percentile --      Girls Diastolic BP Percentile --      Boys Systolic BP Percentile --      Boys Diastolic BP Percentile --      Pulse Rate 05/04/24 0840 99     Resp 05/04/24 0840 20     Temp 05/04/24 0840 98 F (36.7 C)     Temp Source 05/04/24  0840 Oral     SpO2 05/04/24 0840 94 %     Weight --      Height --      Head Circumference --      Peak Flow --      Pain Score 05/04/24 0839 0     Pain Loc --      Pain Education --      Exclude from Growth Chart --    No data found.  Updated Vital Signs BP (!) 142/92 (BP Location: Right Arm)   Pulse 99   Temp 98 F (36.7 C) (Oral)   Resp 20   SpO2 94%   Visual Acuity Right Eye Distance:   Left Eye Distance:   Bilateral Distance:    Right Eye Near:   Left Eye Near:    Bilateral Near:      Exam significantly abbreviated today as decision was made early on to go to the emergency department for further evaluation. Physical Exam Vitals and nursing note reviewed.  Constitutional:      Appearance: Normal appearance.  HENT:     Head:  Atraumatic.     Nose: Nose normal.     Mouth/Throat:     Mouth: Mucous membranes are moist.     Pharynx: Oropharynx is clear.  Eyes:     Extraocular Movements: Extraocular movements intact.     Conjunctiva/sclera: Conjunctivae normal.  Cardiovascular:     Rate and Rhythm: Normal rate.  Pulmonary:     Effort: Pulmonary effort is normal.  Musculoskeletal:        General: Normal range of motion.     Cervical back: Normal range of motion and neck supple.  Skin:    General: Skin is warm and dry.  Neurological:     Mental Status: He is oriented to person, place, and time.     Motor: No weakness.     Gait: Gait normal.  Psychiatric:        Mood and Affect: Mood normal.        Thought Content: Thought content normal.        Judgment: Judgment normal.      UC Treatments / Results  Labs (all labs ordered are listed, but only abnormal results are displayed) Labs Reviewed - No data to display  EKG   Radiology No results found.  Procedures Procedures (including critical care time)  Medications Ordered in UC Medications - No data to display  Initial Impression / Assessment and Plan / UC Course  I have reviewed the triage vital signs and the nursing notes.  Pertinent labs & imaging results that were available during my care of the patient were reviewed by me and considered in my medical decision making (see chart for details).     Given severity of symptoms and vast differential diagnosis, recommended further evaluation in the emergency department where much more thorough and immediate testing can be performed.  He is readily agreeable and wishes to go via private vehicle.  Recommended not driving himself.  Did discuss with patient that until he is cleared after work up he should refrain from driving as a safety precaution due to the unclear nature of his episodes that he has been having.  He acknowledges this recommendation.  He is hemodynamically stable currently to go to the  emergency department via private vehicle.  Final Clinical Impressions(s) / UC Diagnoses   Final diagnoses:  Syncope, unspecified syncope type  Family history of epilepsy   Discharge  Instructions   None    ED Prescriptions   None    PDMP not reviewed this encounter.    [1]  Social History Tobacco Use   Smoking status: Every Day    Current packs/day: 1.00    Types: Cigarettes   Smokeless tobacco: Never  Vaping Use   Vaping status: Never Used  Substance Use Topics   Alcohol use: Yes    Comment: occ   Drug use: No     Stuart Vernell Norris, PA-C 05/07/24 [phone]

## 2024-05-13 DIAGNOSIS — R55 Syncope and collapse: Secondary | ICD-10-CM | POA: Insufficient documentation

## 2024-05-13 NOTE — Progress Notes (Unsigned)
 Cardiology Office Note  Date:  05/14/2024   ID:  Noah Ryan, DOB [birth year], MRN [id number]  PCP:  Patient, No Pcp Per   Chief Complaint  Patient presents with   New Patient (Initial Visit)    [facility] follow up of syncope. Patient c/o several episodes of syncope, arrhythmia and palpitations. Patient denies chest pain or shortness of breath.     HPI:  Noah Ryan is a 28 y.o. male with past medical history of: Past Medical History:  Diagnosis Date   Hypertension   Syncope dating back 4 to 5 years Who presents by referral from Madelin Gentry for syncope  Reports having 5 total episodes of syncope over the past 4 to 5 years 2 episodes several years ago  3 episodes more recently Seen in urgent care May 04, 2024 for several episodes of syncope Per ER notes, he was unconscious for several minutes with some confusion after the episode No urinary incontinence or tongue biting Wife witnessed 1 episode, did not notice convulsions  On further discussion,  reports on first episode he Felt funny, stood up to get air, passed out Second episode 4-5 days later another episode Third episode was at a work meeting, began feeling diaphoretic, walked outside to get air and developed syncope on getting out of the building, this was witnessed by a work animator  Episodes several years ago described by his wife as his body was tensing up, he was sitting at the time, then had syncope  On his syncope episodes, May get clammy before, worse after Unconscious <1 min, confused for minutes after his episodes  Wearing live monitor Zio AT We did contact [employer], requested all downloads from May 10, 2024 when he had episode 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, no arrhythmia was provided for my rhythm only normal sinus rhythm  Otherwise he reports he feels well with no shortness of breath or chest pain on exertion  In terms of his family history  Father with epilesy  EKG  personally reviewed by myself on todays visit EKG Interpretation Date/Time:  Friday May 14 2024 15:04:41 EST Ventricular Rate:  89 PR Interval:  164 QRS Duration:  110 QT Interval:  368 QTC Calculation: 447 R Axis:   29  Text Interpretation: Normal sinus rhythm Inferior infarct , age undetermined When compared with ECG of 04-May-2024 09:43, No significant change was found Confirmed by Perla Lye [phone]) on 05/14/2024 3:13:50 PM     PMH:   has a past medical history of Hypertension.   PSH:    Past Surgical History:  Procedure Laterality Date   FOOT SURGERY     KNEE SURGERY      Current Outpatient Medications  Medication Sig Dispense Refill   oxyCODONE -acetaminophen  (PERCOCET/ROXICET) 5-325 MG tablet Take 1-2 tablets by mouth every 8 (eight) hours as needed for severe pain. (Patient not taking: Reported on 05/14/2024)     No current facility-administered medications for this visit.     Allergies:   Bee venom and Honey bee venom   Social History:  The patient  reports that he has been smoking cigarettes. He started smoking about 15 years ago. He has a 15 pack-year smoking history. He has never used smokeless tobacco. He reports current alcohol use of about 5.0 standard drinks of alcohol per week. He reports current drug use. Drug: Marijuana.   Family History:   family history includes Arrhythmia in his maternal grandfather and paternal uncle; Hyperlipidemia in his father and  maternal grandfather; Hypertension in his father, maternal grandfather, and mother; Valvular heart disease in his paternal uncle.    Review of Systems: Review of Systems  Constitutional: Negative.   HENT: Negative.    Respiratory: Negative.    Cardiovascular: Negative.   Gastrointestinal: Negative.   Musculoskeletal: Negative.   Neurological: Negative.   Psychiatric/Behavioral: Negative.    All other systems reviewed and are negative.   PHYSICAL EXAM: VS:  BP 120/80 (BP Location: Right  Arm, Patient Position: Sitting, Cuff Size: Normal)   Ht 6' 3 (1.905 m)   Wt 249 lb 8 oz (113.2 kg)   SpO2 96%   BMI 31.19 kg/m  , BMI Body mass index is 31.19 kg/m. GEN: Well nourished, well developed, in no acute distress HEENT: normal Neck: no JVD, carotid bruits, or masses Cardiac: RRR; no murmurs, rubs, or gallops,no edema  Respiratory:  clear to auscultation bilaterally, normal work of breathing GI: soft, nontender, nondistended, + BS MS: no deformity or atrophy Skin: warm and dry, no rash Neuro:  Strength and sensation are intact Psych: euthymic mood, full affect  Recent Labs: 05/04/2024: ALT 36; BUN 11; Creatinine, Ser 0.79; Hemoglobin 17.1; Magnesium 2.2; Platelets 267; Potassium 3.8; Sodium 141    Lipid Panel No results found for: CHOL, HDL, LDLCALC, TRIG    Wt Readings from Last 3 Encounters:  05/14/24 249 lb 8 oz (113.2 kg)  05/04/24 207 lb 14.3 oz (94.3 kg)  10/31/16 208 lb (94.3 kg)     ASSESSMENT AND PLAN:  Problem List Items Addressed This Visit     Syncope and collapse - Primary   Relevant Orders   EKG 12-Lead (Completed)   Syncope 5 episodes total dating back 4 to 5 years ago Typically events preceded by tensing up, mild diaphoresis, general feeling of not feeling well -Following episode of syncope, typically recovers within a minute with several minutes of confusion following event - No documentation of hypotension, does not have blood pressure cuff at home - Zio monitor in place, was able to capture most recent episode of syncope May 10, 2024 No arrhythmia noted by downloads after we contacted our rhythm for data -Recommend he continue to wear the Zio monitor, monitor blood pressures at home, Echocardiogram ordered -Referral made to neurology to rule out epilepsy/seizures - He does report father has history of epilepsy - Symptoms less likely vasovagal in nature - If arrhythmia cannot be ruled out and if no structural heart disease,  less likely orthostasis, would pursue neurologic etiology - Orthostatics negative on today's visit, lowest blood pressure 120 over 80s heart rate in the 90s  Signed, Velinda Lunger, M.D., Ph.D. [facility] [place], [place] [phone]

## 2024-05-14 ENCOUNTER — Ambulatory Visit: Attending: Cardiovascular Disease | Admitting: Cardiovascular Disease

## 2024-05-14 ENCOUNTER — Encounter: Payer: Self-pay | Admitting: Cardiovascular Disease

## 2024-05-14 VITALS — BP 120/80 | Ht 75.0 in | Wt 249.5 lb

## 2024-05-14 DIAGNOSIS — R55 Syncope and collapse: Secondary | ICD-10-CM

## 2024-05-14 NOTE — Patient Instructions (Addendum)
 Referral to neurology in [place]  Medication Instructions:  No changes  If you need a refill on your cardiac medications before your next appointment, please call your pharmacy.   Lab work: No new labs needed  Testing/Procedures:  Your physician has requested that you have an echocardiogram. Echocardiography is a painless test that uses sound waves to create images of your heart. It provides your doctor with information about the size and shape of your heart and how well your hearts chambers and valves are working.   You may receive an ultrasound enhancing agent through an IV if needed to better visualize your heart during the echo. This procedure takes approximately one hour.  There are no restrictions for this procedure.  This will take place at [address] [facility] [address] [facility], [place] [address]  Please note: We ask at that you not bring children with you during ultrasound (echo/ vascular) testing. Due to room size and safety concerns, children are not allowed in the ultrasound rooms during exams. Our front office staff cannot provide observation of children in our lobby area while testing is being conducted. An adult accompanying a patient to their appointment will only be allowed in the ultrasound room at the discretion of the ultrasound technician under special circumstances. We apologize for any inconvenience.   Follow-Up: At [facility], you and your health needs are our priority.  As part of our continuing mission to provide you with exceptional heart care, we have created designated Provider Care Teams.  These Care Teams include your primary Cardiologist (physician) and Advanced Practice Providers (APPs -  Physician Assistants and Nurse Practitioners) who all work together to provide you with the care you need, when you need it.  You will need a follow up appointment as needed  Providers on your designated Care Team:   Lonni Meager, NP Bernardino Bring,  PA-C [place]ence [place], [place]  COVID-19 Vaccine Information can be found at: [email or web address] For questions related to vaccine distribution or appointments, please email [email or web address] or call [phone].

## 2024-05-28 ENCOUNTER — Ambulatory Visit: Attending: Cardiovascular Disease

## 2024-05-28 DIAGNOSIS — R55 Syncope and collapse: Secondary | ICD-10-CM | POA: Diagnosis not present

## 2024-05-28 LAB — ECHOCARDIOGRAM COMPLETE
AR max vel: 3.72 cm2
AV Area VTI: 3.44 cm2
AV Area mean vel: 3.75 cm2
AV Mean grad: 3 mmHg
AV Peak grad: 6.2 mmHg
Ao pk vel: 1.24 m/s
Area-P 1/2: 4.1 cm2
S' Lateral: 3.35 cm

## 2024-06-01 ENCOUNTER — Ambulatory Visit: Payer: Self-pay | Admitting: Cardiovascular Disease
